# Patient Record
Sex: Male | Born: 1960 | Race: White | Hispanic: Yes | Smoking: Current some day smoker
Health system: Southern US, Community
[De-identification: ages and names within clinical notes are randomized; demographics above are authoritative.]

---

## 2014-03-31 ENCOUNTER — Emergency Department (HOSPITAL_COMMUNITY): Payer: BC Managed Care – PPO

## 2014-03-31 ENCOUNTER — Encounter (HOSPITAL_COMMUNITY): Payer: Self-pay | Admitting: Emergency Medicine

## 2014-03-31 ENCOUNTER — Emergency Department (HOSPITAL_COMMUNITY)
Admission: EM | Admit: 2014-03-31 | Discharge: 2014-03-31 | Disposition: A | Discharge status: Home / Self Care | Payer: BC Managed Care – PPO | Attending: Emergency Medicine | Admitting: Emergency Medicine

## 2014-03-31 DIAGNOSIS — Y9289 Other specified places as the place of occurrence of the external cause: Secondary | ICD-10-CM | POA: Insufficient documentation

## 2014-03-31 DIAGNOSIS — S6980XA Other specified injuries of unspecified wrist, hand and finger(s), initial encounter: Secondary | ICD-10-CM | POA: Diagnosis present

## 2014-03-31 DIAGNOSIS — F17200 Nicotine dependence, unspecified, uncomplicated: Secondary | ICD-10-CM | POA: Diagnosis not present

## 2014-03-31 DIAGNOSIS — Z23 Encounter for immunization: Secondary | ICD-10-CM | POA: Diagnosis not present

## 2014-03-31 DIAGNOSIS — W260XXA Contact with knife, initial encounter: Secondary | ICD-10-CM | POA: Insufficient documentation

## 2014-03-31 DIAGNOSIS — S61219A Laceration without foreign body of unspecified finger without damage to nail, initial encounter: Secondary | ICD-10-CM

## 2014-03-31 DIAGNOSIS — R000 Tachycardia, unspecified: Secondary | ICD-10-CM | POA: Insufficient documentation

## 2014-03-31 DIAGNOSIS — S61209A Unspecified open wound of unspecified finger without damage to nail, initial encounter: Secondary | ICD-10-CM | POA: Diagnosis not present

## 2014-03-31 DIAGNOSIS — S65509A Unspecified injury of blood vessel of unspecified finger, initial encounter: Secondary | ICD-10-CM | POA: Diagnosis not present

## 2014-03-31 DIAGNOSIS — S6990XA Unspecified injury of unspecified wrist, hand and finger(s), initial encounter: Secondary | ICD-10-CM | POA: Diagnosis present

## 2014-03-31 DIAGNOSIS — Y990 Civilian activity done for income or pay: Secondary | ICD-10-CM | POA: Insufficient documentation

## 2014-03-31 DIAGNOSIS — Y9389 Activity, other specified: Secondary | ICD-10-CM | POA: Diagnosis not present

## 2014-03-31 DIAGNOSIS — S65519A Laceration of blood vessel of unspecified finger, initial encounter: Secondary | ICD-10-CM

## 2014-03-31 DIAGNOSIS — W261XXA Contact with sword or dagger, initial encounter: Secondary | ICD-10-CM

## 2014-03-31 MED ORDER — CEPHALEXIN 500 MG PO CAPS: 500.0000 mg | ORAL_CAPSULE | Freq: Three times a day (TID) | ORAL | Status: AC

## 2014-03-31 MED ORDER — SODIUM CHLORIDE 0.9 % IV BOLUS (SEPSIS)
1000.0000 mL | Freq: Once | INTRAVENOUS | Status: AC
  Administered 2014-03-31: 1000 mL via INTRAVENOUS

## 2014-03-31 MED ORDER — BACITRACIN ZINC 500 UNIT/GM EX OINT: 1.0000 "application " | TOPICAL_OINTMENT | Freq: Two times a day (BID) | CUTANEOUS | Status: AC

## 2014-03-31 MED ORDER — OXYCODONE-ACETAMINOPHEN 5-325 MG PO TABS: 1.0000 | ORAL_TABLET | ORAL | Status: AC | PRN

## 2014-03-31 MED ORDER — TETANUS-DIPHTH-ACELL PERTUSSIS 5-2.5-18.5 LF-MCG/0.5 IM SUSP
0.5000 mL | Freq: Once | INTRAMUSCULAR | Status: AC
  Administered 2014-03-31: 0.5 mL via INTRAMUSCULAR
  Filled 2014-03-31: qty 0.5

## 2014-03-31 MED ORDER — MORPHINE SULFATE 4 MG/ML IJ SOLN
4.0000 mg | Freq: Once | INTRAMUSCULAR | Status: AC
  Administered 2014-03-31: 4 mg via INTRAVENOUS
  Filled 2014-03-31: qty 1

## 2014-03-31 NOTE — ED Notes (Addendum)
Reports cutting right hand while using a knife at work. Lacerations to right little finger and ring finger. Reports having feeling to fingers and able to move them. Pt went to an ucc first, bandaged and bleeding controlled and sent here.

## 2014-03-31 NOTE — ED Notes (Signed)
Patient transported to X-ray 

## 2014-03-31 NOTE — Discharge Instructions (Signed)
Call for a follow up appointment with a Family or Primary Care Provider.  Return if Symptoms worsen.   Take medication as prescribed.  Elevate your hand above your heart to decrease swelling for 15-20 minutes at a time. Keep the wound clean and dry, change your dressing 1 time a day.   Emergency Department Resource Guide 1) Find a Doctor and Pay Out of Pocket Although you won't have to find out who is covered by your insurance plan, it is a good idea to ask around and get recommendations. You will then need to call the office and see if the doctor you have chosen will accept you as a new patient and what types of options they offer for patients who are self-pay. Some doctors offer discounts or will set up payment plans for their patients who do not have insurance, but you will need to ask so you aren't surprised when you get to your appointment.  2) Contact Your Local Health Department Not all health departments have doctors that can see patients for sick visits, but many do, so it is worth a call to see if yours does. If you don't know where your local health department is, you can check in your phone book. The CDC also has a tool to help you locate your state's health department, and many state websites also have listings of all of their local health departments.  3) Find a Walk-in Clinic If your illness is not likely to be very severe or complicated, you may want to try a walk in clinic. These are popping up all over the country in pharmacies, drugstores, and shopping centers. They're usually staffed by nurse practitioners or physician assistants that have been trained to treat common illnesses and complaints. They're usually fairly quick and inexpensive. However, if you have serious medical issues or chronic medical problems, these are probably not your best option.  No Primary Care Doctor: - Call Health Connect at  [phone] - they can help you locate a primary care doctor that  accepts your  insurance, provides certain services, etc. - Physician Referral Service- [phone]  Chronic Pain Problems: Organization         Address  Phone   Notes  [facility]  [phone] Patients need to be referred by their primary care doctor.   Medication Assistance: Organization         Address  Phone   Notes  [facility] [address] [place], [place] [address] [phone] --Must be a resident of [facility] [place] -- Must have NO insurance coverage whatsoever (no Medicaid/ Medicare, etc.) -- The pt. MUST have a primary care doctor that directs their care regularly and follows them in the community   MedAssist  [phone]   [employer]  [phone]    Agencies that provide inexpensive medical care: Organization         Address  Phone   Notes  [facility]  [phone]   [facility]    [phone]   [facility] [address] [place], [place] [address] [phone]   [facility] [address], [place] [phone]   Planned Parenthood    [phone]   [facility]    [phone]   [facility]  [address], [place] Phone:  [phone], Fax:  [phone] Hours of  Operation:  9 am - 6 pm, M-F.  Also accepts Medicaid/Medicare and self-pay.  [facility] for [place], [address], [place] Phone: [phone], Fax: [phone]. Hours of Operation:  8:30 am - 5:30 pm, M-F.  Also accepts Medicaid and self-pay.  [facility] [address], [place] Phone: [phone]   [place], [place], [place] [phone], Ext. [phone] Mondays & Thursdays: 7-9 AM.  First 15 patients are seen on a first come, first serve basis.    [place] Providers:  Organization          Address  Phone   Notes  [facility] [address], [place] [phone] Also accepts self-pay patients.  [facility] [address], [place]  [phone]   [place], [address], [place] [phone]   [facility] [address] [place] [phone]   Lucianne Lei [address], [place]   [phone] Only accepts [place] Access [place] patients after they have their name applied to their card.   Self-Pay (no insurance) in [facility]:  Organization         Address  Phone   Notes  Sickle Cell Patients, [facility] [place] [phone]   [facility] [place] [phone]   [facility] [place]  [place], [place], [place] [phone]   [facility]/Dr. Osei-Bonsu  [address] [place] or [address], [place] [phone] Phone number for both [place] and [place] locations is the same.  [facility] [address] [place] [phone]   [facility] [address], [place] or [address] [phone]   [facility] [address], [place] [phone], phone; [phone], fax Sees patients 1st and 3rd Saturday of every month.  Must not qualify for public or private insurance (i.e. Medicaid, Medicare, [place] Health Choice, Veterans' Benefits)  Household income should be no more than 200% of the poverty level The clinic cannot treat you if you are pregnant or think you are pregnant  Sexually transmitted diseases are not treated at the clinic.    Dental Care: Organization         Address  Phone  Notes  [facility] Department of [facility] [place] [phone] Accepts children up to age 47 who are enrolled in [place] or [place]; pregnant women with a Medicaid card; and children who have applied for Medicaid or [place] Health Choice, but were declined, whose parents can pay a reduced fee at time of service.  [facility] Department of [facility]  [address], [place] [phone] Accepts children up to age 82 who are enrolled in [place] or [place]; pregnant women with a Medicaid card; and children who have applied for Medicaid or [place] Health Choice, but were declined, whose parents can pay a reduced fee at time of service.  [facility] Adult [facility] Access PROGRAM  [place] [phone] Patients are seen by appointment only. Walk-ins are not accepted. [place] will see patients  36 years of age and older. Monday - Tuesday (8am-5pm) Most Wednesdays (8:30-5pm) $30 per visit, cash only  [facility] Adult Dental Access PROGRAM  [address], [facility] [phone] Patients are seen by appointment only. Walk-ins are not accepted. [place] will see patients 34 years of age and older. One Wednesday Evening (Monthly: Volunteer Based).  $30 per visit, cash only  [place]  [phone] for adults; Children under age 73, call Graduate Pediatric Dentistry at [phone]. Children aged 46-14, please call [phone] to request a pediatric application.  Dental services are provided in all areas of dental care including fillings, crowns and bridges, complete and partial dentures, implants, gum treatment, root canals, and extractions. Preventive care is also provided. Treatment is provided to both adults and children. Patients are selected via a lottery and there is often a waiting list.   [facility] [address] [place]  [phone] [email or web address]   [facility] [address] [place], [place]  [phone], Ext. [phone] Second and Fourth Thursday of each month, opens at 6:30 AM; Clinic ends at 9 AM.  Patients are seen on a first-come first-served basis, and a limited number are seen during each clinic.   [facility]  [address] Hillard Danker [place], [place] [phone]   Eligibility Requirements You must have lived in [place], [place], or [place] counties for at least the last three months.   You cannot be eligible for state or federal sponsored [employer], including [employer], [place], or [employer].   You generally cannot be eligible for healthcare insurance through your employer.    How to apply: Eligibility screenings are held every Tuesday and Wednesday afternoon from 1:00 pm until 4:00 pm. You do not need an appointment for the interview!  [facility] [address] [place], [place]   [place]  [place] Department  [place]  [phone]    Behavioral Health Resources in the Community: Intensive Outpatient Programs Organization         Address  Phone  Notes  [facility] [place]. [address] [place], [place] [phone]   [facility] Outpatient [address], [place], [place]   ADS: Alcohol & Drug Svcs [address], [place], [place]   [place] [address]  [place], [place] or [phone]   Substance Abuse Resources Organization         Address  Phone  Notes  Alcohol and Drug Services  [phone]   [place]  [phone]   [facility] [place]   Chinita Pester  [phone]   Residential & Outpatient Substance Abuse Program  [phone]   Psychological Services Organization         Address  Phone  Notes  [facility] [place]  [place]  [phone]    [place] [address], [place] or [phone]    Mobile Crisis Teams Organization         Address  Phone  Notes  [facility], Mobile Crisis Care Unit  [phone]   [facility] Psychotherapeutic Services  [address] [place], [place]   [facility] [address] [place] [phone]    Self-Help/Support Groups Organization  Address  Phone             Notes  [place]. of [place] - variety of support groups  [place] Call for more information  Narcotics Anonymous (NA), [facility] [address], [employer] [place]  2 meetings at this location   Special educational needs teacher         Address  Phone  Notes  ASAP Residential Treatment [place],    [place]  [phone]   [facility]  [address] [place] [phone], [place], [place]   [place], [place] [phone] Admissions: 8am-3pm M-F  Incentives Substance [place] [address]    [place], [place] [phone]   [facility] [address] [place], [place], [place]   [facility] [address]  [place], [place]   [facility] Programs - Intensive Outpatient [address], [place], [place]   [facility] ([place] [address]  [place], [place] [phone] or [phone]   [facility] Services (RTS) [address] [place], [place] Accepts Medicaid  [facility] [place] [address]  [place] [phone] Substance Abuse/Addiction Treatment   [facility] Organization         Address  Phone  Notes  [facility]  [phone]   Domenic Schwab, PhD [address] Arlis Porta [place], [place]   [phone] or [phone]   [place]  [place], [place] [phone]   [facility] [address], [place], [place] [phone] Insurance/Medicaid/sponsorship through [facility] and Families [address] [place]                                    [place], [place] [phone] [place] [address], [place] [phone]    Dr. Adele Schilder  [phone]   [facility] Dept. 1) [address] [place] 2) [place] 3)  [place] [address], [place] [phone]  [phone]   [place] [phone] or [phone] (After Hours)

## 2014-03-31 NOTE — ED Notes (Addendum)
Tourniquet not put back on due to wound just oozing at present time

## 2014-03-31 NOTE — ED Notes (Signed)
MD at bedside. 

## 2014-03-31 NOTE — Op Note (Signed)
*   No surgery found *  7:26 PM  PATIENT:  Randall RevealLuis Love  53 y.o. male  PRE-OPERATIVE DIAGNOSIS:  Complex laceration of RRF, RSF  POST-OPERATIVE DIAGNOSIS:  Same  PROCEDURE:  Exploration of laceration of RRF, repair of laceration RRF measuring approx 4 cm; Repair of laceration of RSF 2cm  SURGEON:  Girtrude Enslin  ANESTHESIA:   local  SPECIMEN:  No Specimen    PATIENT DISPOSITION:  d/c home with dressing instructions.

## 2014-03-31 NOTE — ED Provider Notes (Signed)
CSN: [id number]     Arrival date & time 03/31/14  1607 History  This chart was scribed for non-physician practitioner working with Hurman HornJohn M Bednar, MD by Elveria Risingimelie Horne, ED Scribe. This patient was seen in room TR10C/TR10C and the patient's care was started at 4:22 PM.   Chief Complaint  Patient presents with  . Hand Injury     HPI Comments: Randall Love is a [age] y.o. male who presents to the Emergency Department from Urgent Care with a right hand injury incurred today while working with a knife. Patient presents with his fingers wrapped in gauze. His laceration, located on the fourth finger, is actively bleeding during evaluation. Patient states that he is able to move the finger, but reports developing numbness in his hand.  Patient is right hand dominant. Patient is roofer by profession. Patient is uncertain of last Tetanus. NO PCP   The history is provided by the patient. No language interpreter was used.    History reviewed. No pertinent past medical history. History reviewed. No pertinent past surgical history. History reviewed. No pertinent family history. History  Substance Use Topics  . Smoking status: Current Some Day Smoker  . Smokeless tobacco: Not on file  . Alcohol Use: Yes     Comment: occ    Review of Systems  Constitutional: Negative for fever and chills.  Gastrointestinal: Negative for nausea.  Skin: Positive for wound.  Neurological: Positive for numbness. Negative for dizziness and weakness.      Allergies  Review of patient's allergies indicates no known allergies.  Home Medications   Prior to Admission medications   Not on File   Triage Vitals: BP 129/64  Pulse 100  Temp(Src) 97.7 F (36.5 C) (Oral)  Resp 18  Ht 5\' 7"  (1.702 m)  Wt 146 lb (66.225 kg)  BMI 22.86 kg/m2  SpO2 100% Physical Exam  Nursing note and vitals reviewed. Constitutional: He is oriented to person, place, and time. He appears well-developed and well-nourished. No distress.   Appears uncomfortable  HENT:  Head: Normocephalic and atraumatic.  Eyes: EOM are normal.  Neck: Normal range of motion. Neck supple.  Cardiovascular: Regular rhythm.  Tachycardia present.   Pulses:      Radial pulses are 2+ on the right side.  Pulmonary/Chest: Effort normal. No respiratory distress.  Musculoskeletal: Normal range of motion.  Right hand: 5 cm laceration on medial aspect of fourth finger, deep with pulsatile bleeding. 2 point discrimination intact distally. 5/5 flexors and extensor strength. Full active range of motion. 1.5 cm laceration on dorsal aspect of fifth finger extending into the PIP. No obvious FB. Full active ROM.  Neurological: He is alert and oriented to person, place, and time.  Skin: Skin is warm and dry. He is not diaphoretic.  Psychiatric: He has a normal mood and affect. His behavior is normal.    ED Course  Procedures (including critical care time) COORDINATION OF CARE: 4:42 PM- Discussed treatment plan with patient at bedside and patient agreed to plan.   Labs Review Labs Reviewed - No data to display  Imaging Review Dg Hand Complete Right  03/31/2014   CLINICAL DATA:  Laceration to the ring finger  EXAM: RIGHT HAND - COMPLETE 3+ VIEW  COMPARISON:  None.  FINDINGS: There is no evidence of fracture or dislocation. There is no evidence of arthropathy or other focal bone abnormality. There is a soft tissue laceration at the base of the left fourth digit.  IMPRESSION: No acute osseous injury  of the right hand.   Electronically Signed   By: Elige Ko   On: 03/31/2014 18:29     EKG Interpretation None      MDM   Final diagnoses:  Laceration of digital artery, initial encounter  Laceration of finger of right hand, initial encounter   Patient presents with a proximally 5.5 cm laceration to the medial aspect of the right fourth digit good strength with flexion and extension, 2 point discrimination intact. There is pulsatile bleeding from the  wound. Discussed with Dr. Lestine Box also evaluate the patient during this encounter. Consult to hand, update tetanus, XR ordered, IV fluids, and pain medication given. Tourniquet applied at 1641, removed at 1710.  Re-paged Dr. Izora Ribas, left message. Tourniquet reapplied approximately 10 minutes later and removed approximately 30 minutes later. After 10 minutes the wound was reevaluated, no pulsatile or oozing blood on exam. Will not reapply tourniquet.  Discussed with Charge nurse, paged Dr. Izora Ribas. Dr. Izora Ribas evaluated the patient in emergency room in, laceration repaired. Will evaluate patient in clinic in approximately 10 days. Advised placing the patient on Keflex and pain medication. Discussed imaging results, and treatment plan with the patient. Return precautions given. Reports understanding and no other concerns at this time.  Patient is stable for discharge at this time.  Meds given in ED:  Medications  sodium chloride 0.9 % bolus 1,000 mL (0 mLs Intravenous Stopped 03/31/14 1939)  morphine 4 MG/ML injection 4 mg (4 mg Intravenous Given 03/31/14 1701)  Tdap (BOOSTRIX) injection 0.5 mL (0.5 mLs Intramuscular Given 03/31/14 1701)    Discharge Medication List as of 03/31/2014  7:38 PM    START taking these medications   Details  bacitracin ointment Apply 1 application topically 2 (two) times daily., Starting 03/31/2014, Until Discontinued, Print    cephALEXin (KEFLEX) 500 MG capsule Take 1 capsule (500 mg total) by mouth 3 (three) times daily., Starting 03/31/2014, Until Discontinued, Print    oxyCODONE-acetaminophen (PERCOCET/ROXICET) 5-325 MG per tablet Take 1 tablet by mouth every 4 (four) hours as needed for severe pain., Starting 03/31/2014, Until Discontinued, Print       I personally performed the services described in this documentation, which was scribed in my presence. The recorded information has been reviewed and is accurate.    Clabe Seal, PA-C 04/01/14 [phone]

## 2014-03-31 NOTE — ED Provider Notes (Signed)
Medical screening examination/treatment/procedure(s) were conducted as a shared visit with non-physician practitioner(s) and myself.  I personally evaluated the patient during the encounter.  Right ring finger ulnar aspect 5 cm irregular laceration with arterial involvement with 2-point examination intact as well as 5 out of 5 motor against resistance FDP FDS and extension   Hurman HornJohn M Matheu Ploeger, MD 04/01/14 1251

## 2014-03-31 NOTE — Consult Note (Signed)
Reason for Consult:finger injury Referring Physician: ER  CC:I cut my finger  HPI:  Randall Love is an 53 y.o. right handed male who presents with   Laceration to RRF, RSF.  Pt was holding a knife to cut something and the object slipped through R hand lacerating RF and SF.       Pain is rated at 10   /10 and is described as sharp.  Pain is constant.  Pain is made better by rest/immobilization, worse with motion.   Associated signs/symptoms:active bleeding of RF Previous treatment:  n/a  History reviewed. No pertinent past medical history.  History reviewed. No pertinent past surgical history.  History reviewed. No pertinent family history.  Social History:  reports that he has been smoking.  He does not have any smokeless tobacco history on file. He reports that he drinks alcohol. He reports that he does not use illicit drugs.  Allergies: No Known Allergies  Medications: I have reviewed the patient's current medications.  No results found for this or any previous visit (from the past 48 hour(s)).  Dg Hand Complete Right  03/31/2014   CLINICAL DATA:  Laceration to the ring finger  EXAM: RIGHT HAND - COMPLETE 3+ VIEW  COMPARISON:  None.  FINDINGS: There is no evidence of fracture or dislocation. There is no evidence of arthropathy or other focal bone abnormality. There is a soft tissue laceration at the base of the left fourth digit.  IMPRESSION: No acute osseous injury of the right hand.   Electronically Signed   By: Elige KoHetal  Patel   On: 03/31/2014 18:29    Pertinent items are noted in HPI. Temp:  [97.7 F (36.5 C)] 97.7 F (36.5 C) (07/18 1611) Pulse Rate:  [88-100] 88 (07/18 1733) Resp:  [15-18] 15 (07/18 1733) BP: (117-129)/(64-66) 117/66 mmHg (07/18 1733) SpO2:  [100 %] 100 % (07/18 1733) Weight:  [66.225 kg (146 lb)] 66.225 kg (146 lb) (07/18 1611) General appearance: alert and cooperative Resp: clear to auscultation bilaterally Cardio: regular rate and rhythm GI: soft,  non-tender; bowel sounds normal; no masses,  no organomegaly Extremities: extremities normal, atraumatic, no cyanosis or edema and except for RRF with mid lateral laceration of ulnar side of RF extends from pipj to distal finger tip, no current active bleeding, pt able to flex distal and prox phalanx, gross sensation intact; second laceration of dorsal SF over prox phalanx, able to fully extend finger, n/v intact   Assessment: Complex laceration to RRF, laceration to RSF Plan: Will explore, wash out and close both wounds. Place on antibiotics, wound care discussed with patient.  F/U in 10 days. I have discussed this treatment plan in detail with patient , including the risks of the recommended treatment or surgery, the benefits and the alternatives.  The patient  understands that additional treatment may be necessary.  Mickie Kozikowski CHRISTOPHER 03/31/2014, 7:22 PM

## 2014-03-31 NOTE — ED Notes (Signed)
Hand specialist in with pt

## 2015-09-11 IMAGING — CR DG HAND COMPLETE 3+V*R*
3 series · 3 of 3 positions shown · non-contrast
Comparison: None.

CLINICAL DATA: Laceration to the ring finger

EXAM:
RIGHT HAND - COMPLETE 3+ VIEW

[PA]
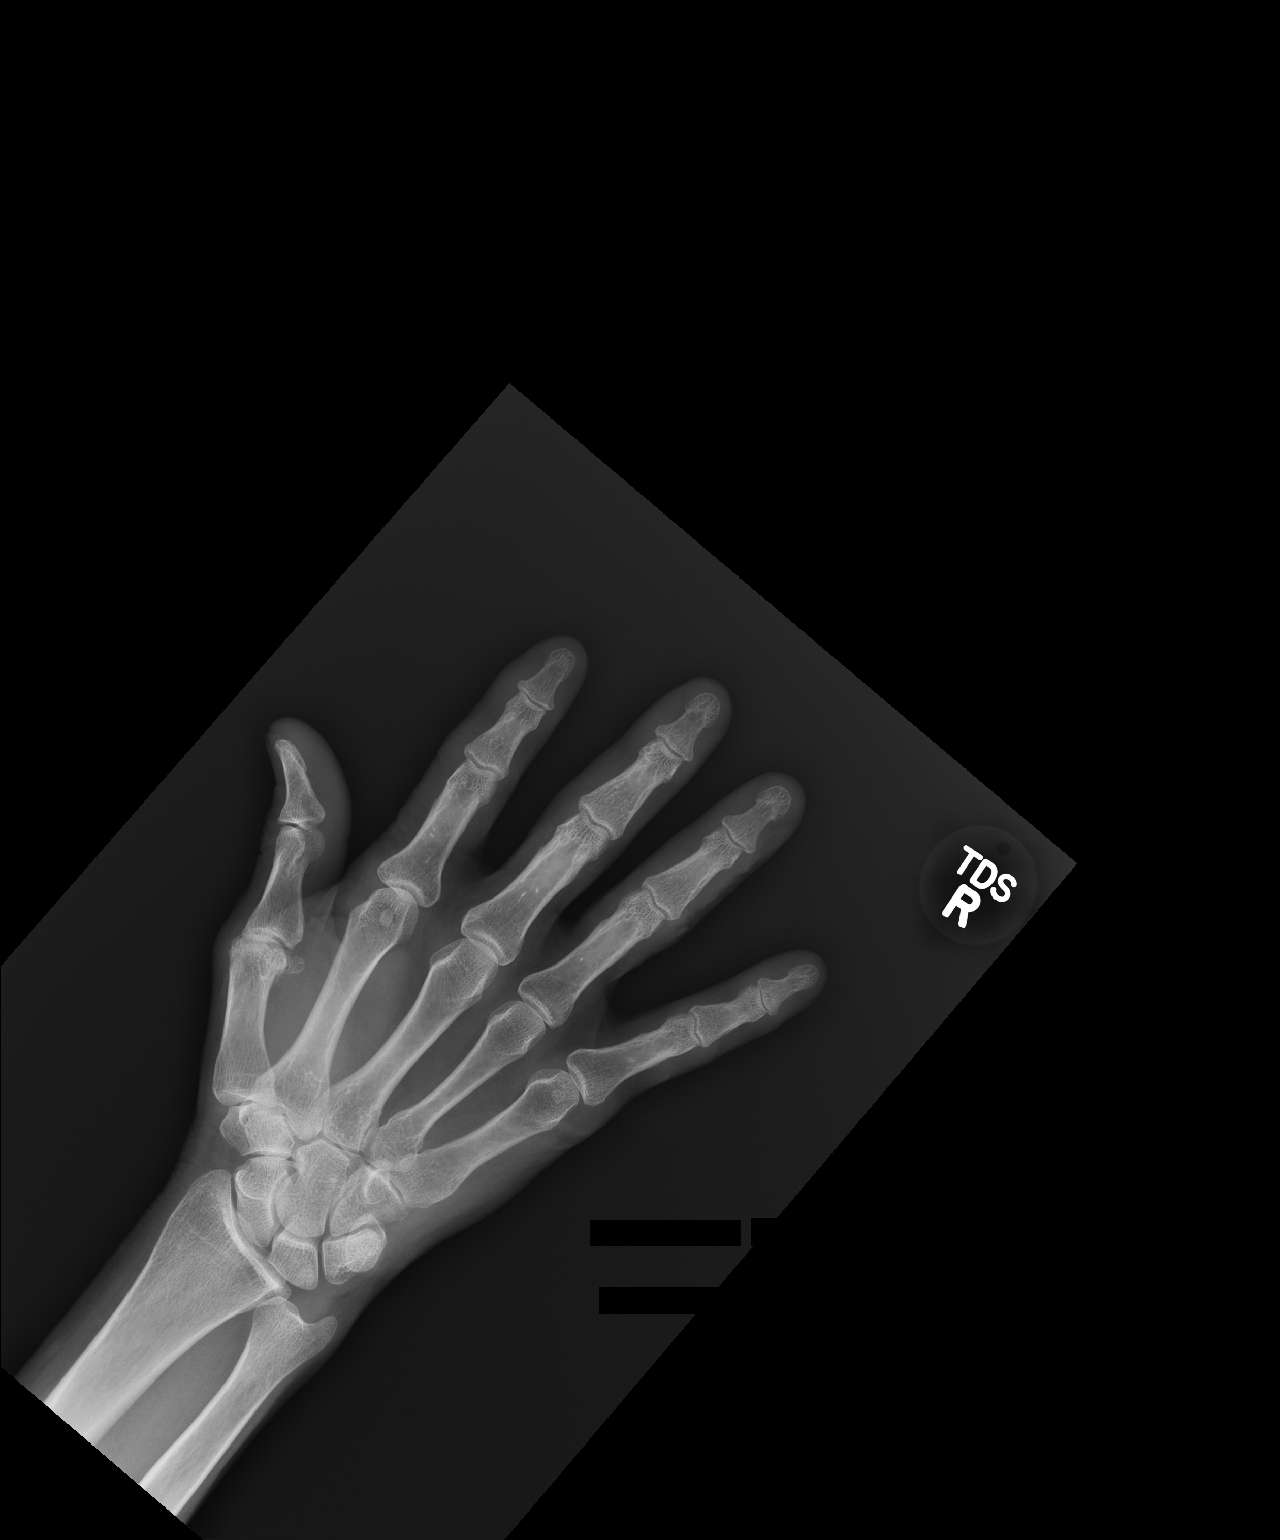

[lateral]
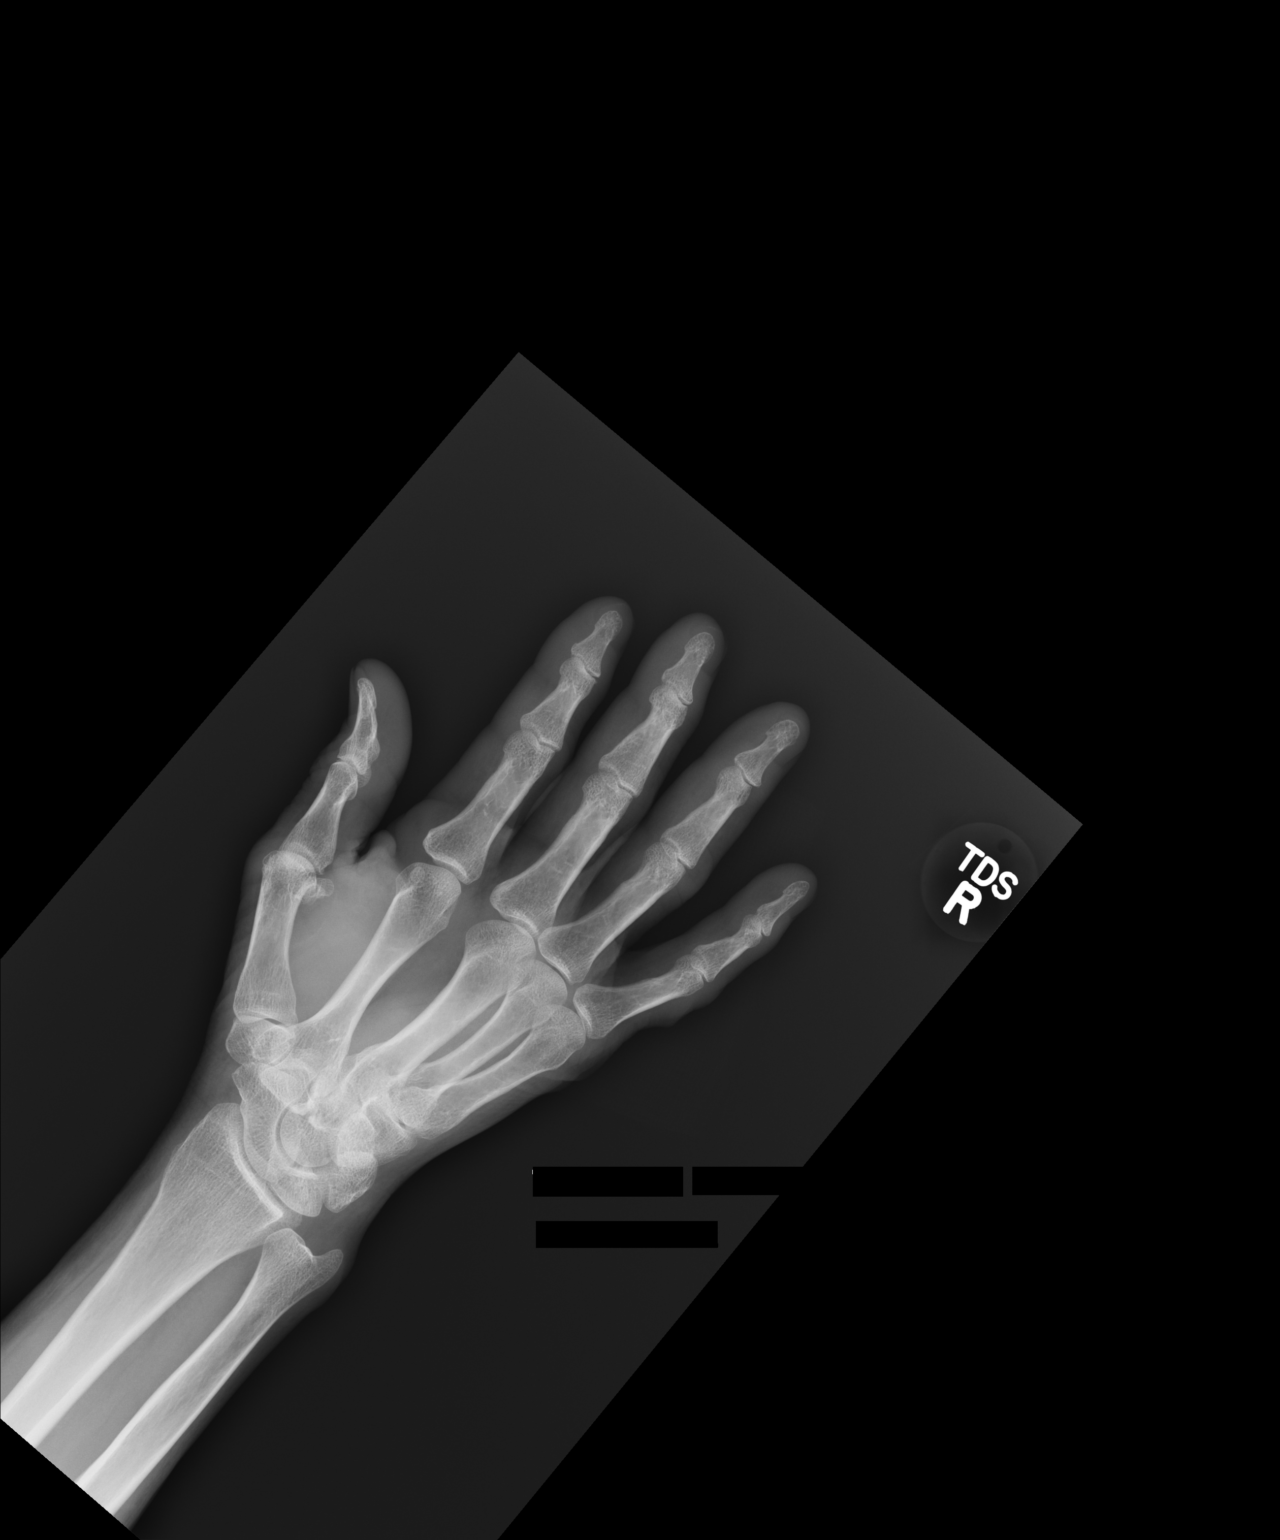

[pa obl]
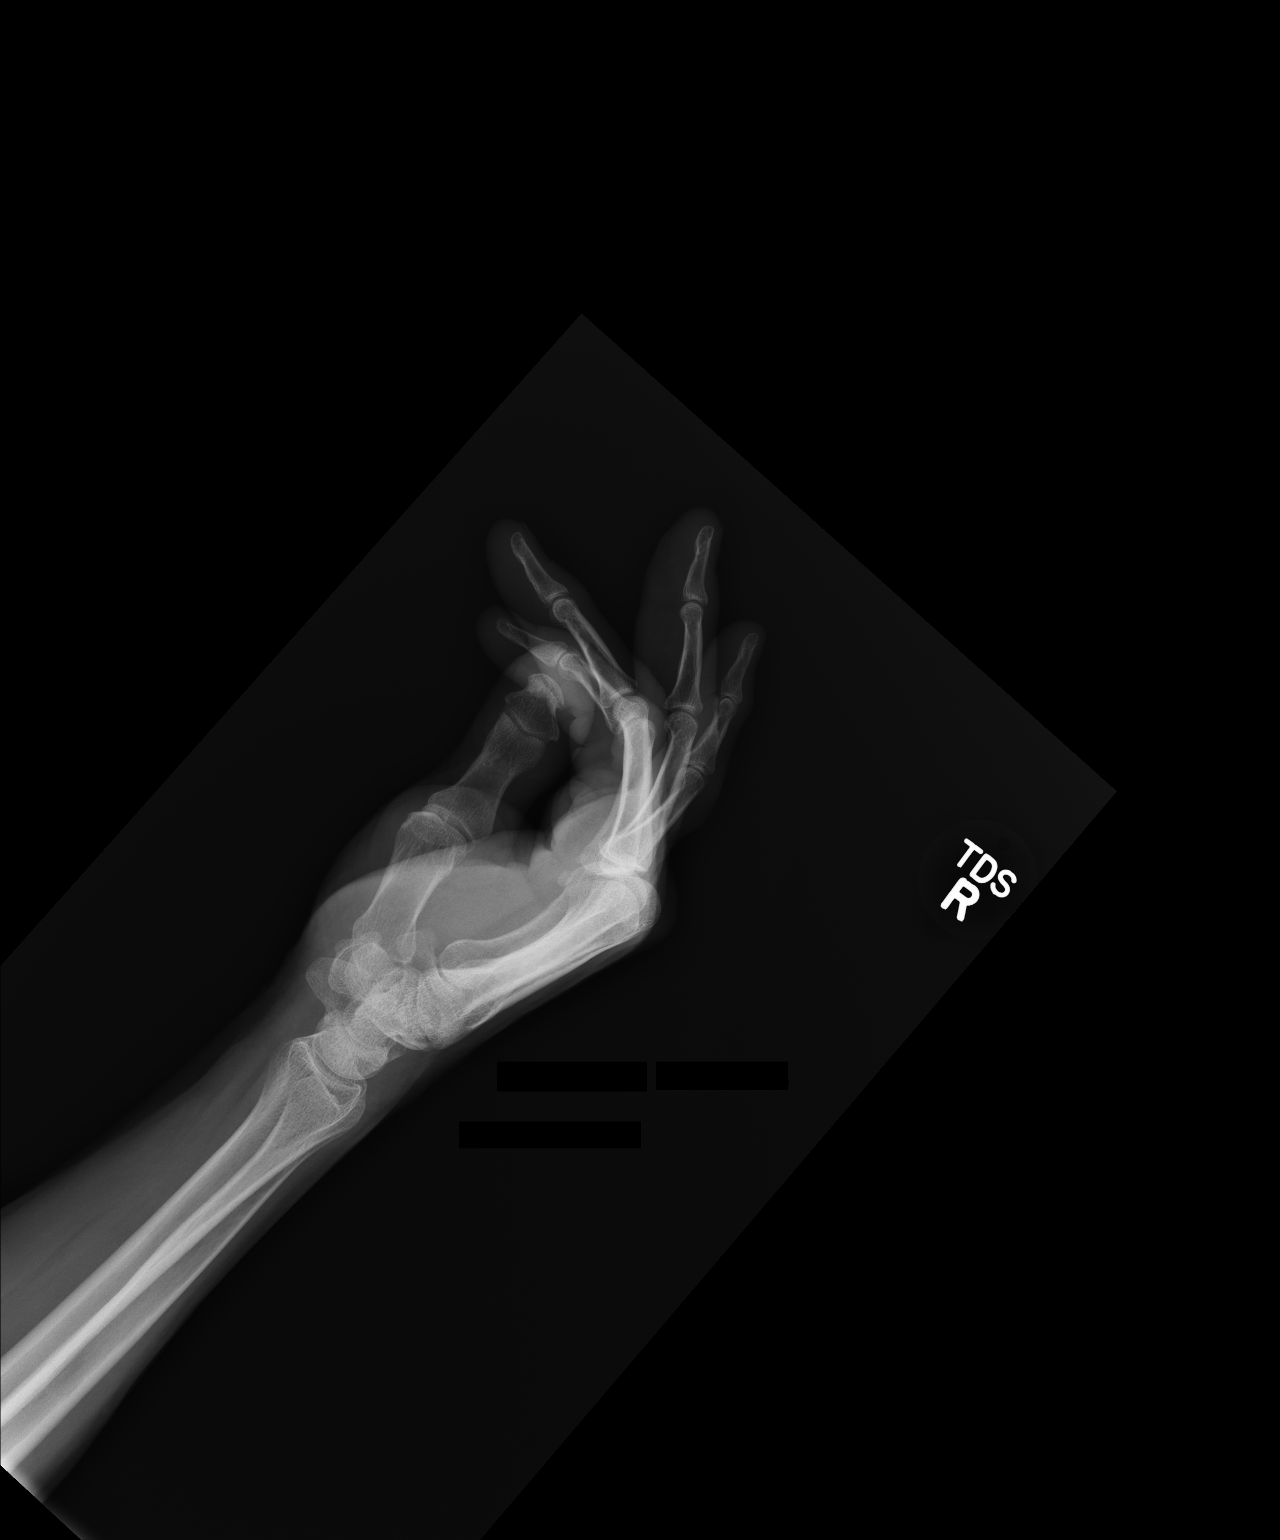

[3 of 3 positions shown; findings below may reference images not displayed]

FINDINGS: There is no evidence of fracture or dislocation. There is no
evidence of arthropathy or other focal bone abnormality. There is a
soft tissue laceration at the base of the left fourth digit.
IMPRESSION: No acute osseous injury of the right hand.

## 2018-07-15 DEATH — deceased
